# Patient Record
Sex: Female | Born: 1965 | Hispanic: No | Smoking: Current every day smoker
Health system: Southern US, Community
[De-identification: ages and names within clinical notes are randomized; demographics above are authoritative.]

## PROBLEM LIST (undated history)

## (undated) DIAGNOSIS — C73 Malignant neoplasm of thyroid gland: Secondary | ICD-10-CM

## (undated) DIAGNOSIS — F1011 Alcohol abuse, in remission: Secondary | ICD-10-CM

## (undated) DIAGNOSIS — N289 Disorder of kidney and ureter, unspecified: Secondary | ICD-10-CM

## (undated) DIAGNOSIS — J45909 Unspecified asthma, uncomplicated: Secondary | ICD-10-CM

## (undated) DIAGNOSIS — K769 Liver disease, unspecified: Secondary | ICD-10-CM

## (undated) DIAGNOSIS — F319 Bipolar disorder, unspecified: Secondary | ICD-10-CM

## (undated) DIAGNOSIS — F1110 Opioid abuse, uncomplicated: Secondary | ICD-10-CM

---

## 1999-04-02 ENCOUNTER — Emergency Department (HOSPITAL_COMMUNITY): Admission: EM | Admit: 1999-04-02 | Discharge: 1999-04-02 | Payer: Self-pay | Admitting: *Deleted

## 1999-04-24 ENCOUNTER — Encounter: Payer: Self-pay | Admitting: Family Medicine

## 1999-04-24 ENCOUNTER — Emergency Department (HOSPITAL_COMMUNITY): Admission: EM | Admit: 1999-04-24 | Discharge: 1999-04-24 | Payer: Self-pay | Admitting: Emergency Medicine

## 1999-05-06 ENCOUNTER — Encounter: Payer: Self-pay | Admitting: Emergency Medicine

## 1999-05-06 ENCOUNTER — Emergency Department (HOSPITAL_COMMUNITY): Admission: EM | Admit: 1999-05-06 | Discharge: 1999-05-06 | Payer: Self-pay | Admitting: Emergency Medicine

## 1999-06-22 ENCOUNTER — Encounter: Payer: Self-pay | Admitting: Emergency Medicine

## 1999-06-22 ENCOUNTER — Emergency Department (HOSPITAL_COMMUNITY): Admission: EM | Admit: 1999-06-22 | Discharge: 1999-06-22 | Payer: Self-pay | Admitting: Emergency Medicine

## 2005-09-29 ENCOUNTER — Ambulatory Visit: Payer: Self-pay

## 2005-09-29 ENCOUNTER — Emergency Department: Payer: Self-pay | Admitting: Emergency Medicine

## 2008-01-07 ENCOUNTER — Emergency Department: Payer: Self-pay | Admitting: Emergency Medicine

## 2008-01-13 ENCOUNTER — Ambulatory Visit: Payer: Self-pay | Admitting: Urology

## 2008-03-06 ENCOUNTER — Emergency Department: Payer: Self-pay | Admitting: Emergency Medicine

## 2008-07-21 ENCOUNTER — Emergency Department: Payer: Self-pay | Admitting: Emergency Medicine

## 2010-02-17 ENCOUNTER — Emergency Department: Payer: Self-pay | Admitting: Unknown Physician Specialty

## 2010-02-28 ENCOUNTER — Emergency Department: Payer: Self-pay | Admitting: Emergency Medicine

## 2010-03-17 ENCOUNTER — Emergency Department: Payer: Self-pay | Admitting: Emergency Medicine

## 2010-03-21 ENCOUNTER — Emergency Department: Payer: Self-pay | Admitting: Emergency Medicine

## 2010-07-10 ENCOUNTER — Emergency Department: Payer: Self-pay | Admitting: Emergency Medicine

## 2010-08-21 ENCOUNTER — Emergency Department: Payer: Self-pay | Admitting: Emergency Medicine

## 2010-09-03 ENCOUNTER — Emergency Department: Payer: Self-pay | Admitting: Emergency Medicine

## 2010-09-06 ENCOUNTER — Emergency Department: Payer: Self-pay | Admitting: Unknown Physician Specialty

## 2011-01-13 ENCOUNTER — Emergency Department: Payer: Self-pay | Admitting: Internal Medicine

## 2011-01-27 ENCOUNTER — Emergency Department: Payer: Self-pay | Admitting: Emergency Medicine

## 2011-04-02 ENCOUNTER — Emergency Department: Payer: Self-pay | Admitting: Emergency Medicine

## 2011-05-27 ENCOUNTER — Emergency Department: Payer: Self-pay | Admitting: Emergency Medicine

## 2011-05-29 ENCOUNTER — Ambulatory Visit: Payer: Self-pay | Admitting: Urology

## 2011-06-02 ENCOUNTER — Emergency Department: Payer: Self-pay | Admitting: Emergency Medicine

## 2011-06-06 ENCOUNTER — Emergency Department: Payer: Self-pay | Admitting: Emergency Medicine

## 2011-07-02 ENCOUNTER — Ambulatory Visit: Payer: Self-pay | Admitting: Urology

## 2011-07-23 ENCOUNTER — Emergency Department: Payer: Self-pay | Admitting: Emergency Medicine

## 2011-07-23 LAB — URINALYSIS, COMPLETE
Bacteria: NONE SEEN
Bilirubin,UR: NEGATIVE
Glucose,UR: NEGATIVE mg/dL (ref 0–75)
Ketone: NEGATIVE
Leukocyte Esterase: NEGATIVE
Nitrite: NEGATIVE
Ph: 7 (ref 4.5–8.0)
Protein: NEGATIVE
RBC,UR: 240 /HPF (ref 0–5)
Specific Gravity: 1.018 (ref 1.003–1.030)
Squamous Epithelial: 2
WBC UR: <1 /HPF (ref 0–5)

## 2011-08-25 ENCOUNTER — Emergency Department: Payer: Self-pay | Admitting: Emergency Medicine

## 2011-08-25 LAB — URINALYSIS, COMPLETE
Bacteria: NONE SEEN
Bilirubin,UR: NEGATIVE
Glucose,UR: NEGATIVE mg/dL (ref 0–75)
Ketone: NEGATIVE
Leukocyte Esterase: NEGATIVE
Nitrite: NEGATIVE
Ph: 6 (ref 4.5–8.0)
Protein: NEGATIVE
RBC,UR: 361 /HPF (ref 0–5)
Specific Gravity: 1.017 (ref 1.003–1.030)
Squamous Epithelial: 5
WBC UR: 2 /HPF (ref 0–5)

## 2011-09-18 ENCOUNTER — Emergency Department: Payer: Self-pay | Admitting: Emergency Medicine

## 2011-09-18 LAB — BASIC METABOLIC PANEL
Anion Gap: 8 (ref 7–16)
BUN: 10 mg/dL (ref 7–18)
Calcium, Total: 9 mg/dL (ref 8.5–10.1)
Chloride: 105 mmol/L (ref 98–107)
Co2: 26 mmol/L (ref 21–32)
Creatinine: 0.83 mg/dL (ref 0.60–1.30)
EGFR (African American): >60
EGFR (Non-African Amer.): >60
Glucose: 108 mg/dL — ABNORMAL HIGH (ref 65–99)
Osmolality: 277 (ref 275–301)
Potassium: 4 mmol/L (ref 3.5–5.1)
Sodium: 139 mmol/L (ref 136–145)

## 2011-09-18 LAB — CBC
HCT: 40.3 % (ref 35.0–47.0)
HGB: 13.7 g/dL (ref 12.0–16.0)
MCH: 32.9 pg (ref 26.0–34.0)
MCHC: 34 g/dL (ref 32.0–36.0)
MCV: 97 fL (ref 80–100)
Platelet: 360 10*3/uL (ref 150–440)
RBC: 4.17 10*6/uL (ref 3.80–5.20)
RDW: 12.7 % (ref 11.5–14.5)
WBC: 8.7 10*3/uL (ref 3.6–11.0)

## 2011-09-18 LAB — URINALYSIS, COMPLETE
Bacteria: NONE SEEN
Bilirubin,UR: NEGATIVE
Glucose,UR: NEGATIVE mg/dL (ref 0–75)
Leukocyte Esterase: NEGATIVE
Nitrite: NEGATIVE
Ph: 5 (ref 4.5–8.0)
Protein: NEGATIVE
RBC,UR: 952 /HPF (ref 0–5)
Specific Gravity: 1.023 (ref 1.003–1.030)
Squamous Epithelial: 15
WBC UR: NONE SEEN /HPF (ref 0–5)

## 2011-09-25 LAB — URINE CULTURE

## 2011-12-20 ENCOUNTER — Emergency Department: Payer: Self-pay | Admitting: *Deleted

## 2011-12-20 LAB — TSH: Thyroid Stimulating Horm: 15.6 u[IU]/mL — ABNORMAL HIGH

## 2011-12-20 LAB — URINALYSIS, COMPLETE
Bacteria: NONE SEEN
Bilirubin,UR: NEGATIVE
Blood: NEGATIVE
Glucose,UR: NEGATIVE mg/dL (ref 0–75)
Leukocyte Esterase: NEGATIVE
Nitrite: NEGATIVE
Ph: 6 (ref 4.5–8.0)
Protein: NEGATIVE
RBC,UR: NONE SEEN /HPF (ref 0–5)
Specific Gravity: 1.014 (ref 1.003–1.030)
Squamous Epithelial: 1
WBC UR: <1 /HPF (ref 0–5)

## 2011-12-20 LAB — PREGNANCY, URINE: Pregnancy Test, Urine: NEGATIVE m[IU]/mL

## 2012-09-14 ENCOUNTER — Emergency Department: Payer: Self-pay | Admitting: Emergency Medicine

## 2012-11-03 ENCOUNTER — Emergency Department: Payer: Self-pay | Admitting: Emergency Medicine

## 2012-11-03 LAB — URINALYSIS, COMPLETE
Ketone: NEGATIVE
Nitrite: NEGATIVE
Ph: 5 (ref 4.5–8.0)
Protein: 100
Specific Gravity: 1.01 (ref 1.003–1.030)
Squamous Epithelial: 3
WBC UR: 37 /HPF (ref 0–5)

## 2012-11-03 LAB — LIPASE, BLOOD: Lipase: 242 U/L (ref 73–393)

## 2012-11-03 LAB — CBC
HCT: 39.1 % (ref 35.0–47.0)
HGB: 13.4 g/dL (ref 12.0–16.0)
MCH: 32.6 pg (ref 26.0–34.0)
MCV: 95 fL (ref 80–100)
Platelet: 355 10*3/uL (ref 150–440)
RBC: 4.11 10*6/uL (ref 3.80–5.20)
RDW: 14.2 % (ref 11.5–14.5)
WBC: 16.1 10*3/uL — ABNORMAL HIGH (ref 3.6–11.0)

## 2012-11-03 LAB — COMPREHENSIVE METABOLIC PANEL
Bilirubin,Total: 0.2 mg/dL (ref 0.2–1.0)
Creatinine: 1.04 mg/dL (ref 0.60–1.30)
SGOT(AST): 26 U/L (ref 15–37)
Sodium: 135 mmol/L — ABNORMAL LOW (ref 136–145)

## 2012-12-11 ENCOUNTER — Emergency Department: Payer: Self-pay | Admitting: Emergency Medicine

## 2012-12-11 LAB — BASIC METABOLIC PANEL
Anion Gap: 8 (ref 7–16)
Calcium, Total: 8.9 mg/dL (ref 8.5–10.1)
Chloride: 109 mmol/L — ABNORMAL HIGH (ref 98–107)
Co2: 24 mmol/L (ref 21–32)
Glucose: 147 mg/dL — ABNORMAL HIGH (ref 65–99)
Potassium: 3.6 mmol/L (ref 3.5–5.1)

## 2012-12-11 LAB — CBC
HGB: 13.3 g/dL (ref 12.0–16.0)
MCV: 95 fL (ref 80–100)
Platelet: 361 10*3/uL (ref 150–440)
RBC: 4.04 10*6/uL (ref 3.80–5.20)

## 2013-01-14 ENCOUNTER — Emergency Department: Payer: Self-pay | Admitting: Emergency Medicine

## 2013-01-14 LAB — CBC
HCT: 37.5 % (ref 35.0–47.0)
HGB: 12.9 g/dL (ref 12.0–16.0)
MCH: 32.1 pg (ref 26.0–34.0)
MCV: 93 fL (ref 80–100)
Platelet: 345 10*3/uL (ref 150–440)
WBC: 8.8 10*3/uL (ref 3.6–11.0)

## 2013-05-03 ENCOUNTER — Emergency Department: Payer: Self-pay | Admitting: Emergency Medicine

## 2013-09-08 IMAGING — CR DG ABDOMEN 1V
1 series · 1 of 1 positions shown · non-contrast
Comparison: none

REASON FOR EXAM: RENAL COLIC
COMMENTS:

[view not recorded]
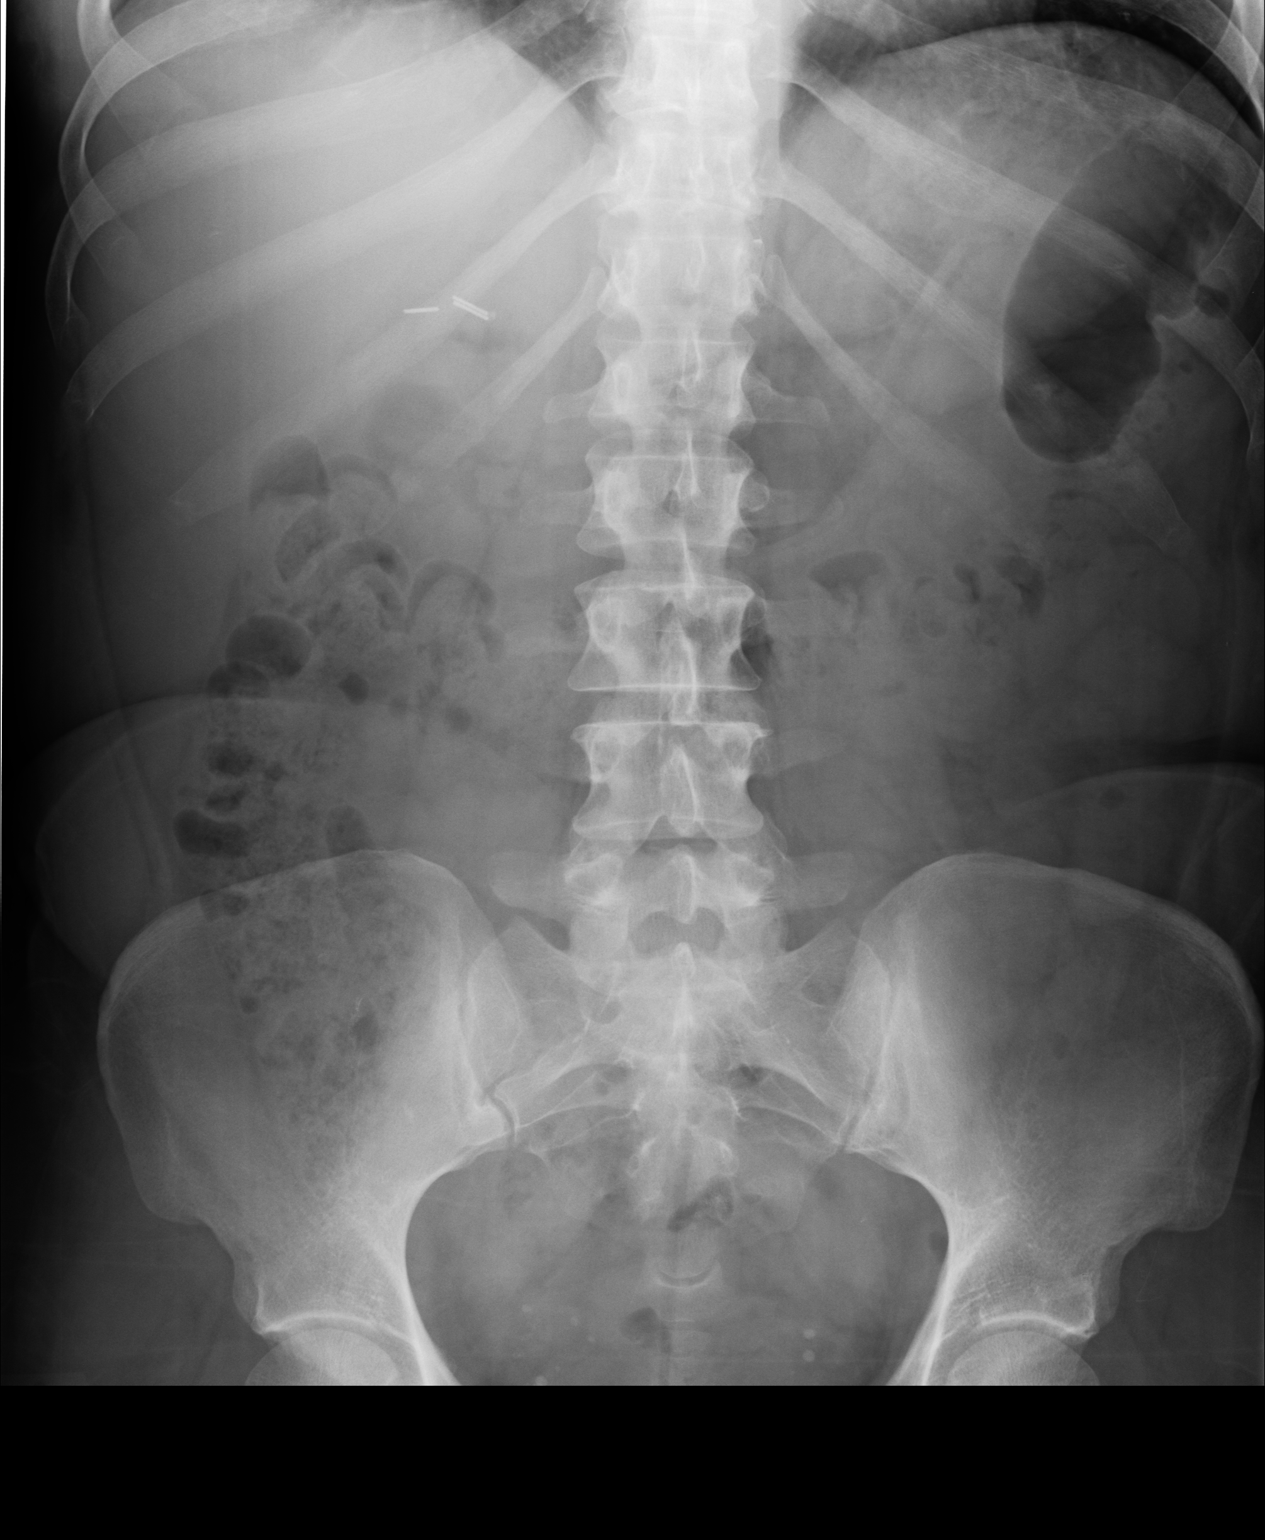

[1 of 1 positions shown; findings below may reference images not displayed]

PROCEDURE:     DXR - DXR KIDNEY URETER BLADDER  - May 29, 2011  [DATE]

RESULT:     An AP view of the abdomen is compared to the prior exam of
01/13/2008. No definite renal or ureteral calcifications are identified by
routine radiography. Phleboliths are noted in the pelvic area bilaterally.
The bowel gas pattern is normal. There is a moderate amount of fecal
material in the colon. Postoperative metallic clips are noted in the region
of the gallbladder bed.
IMPRESSION: 1.     No definite renal or ureteral calcifications are identified by
routine radiography.

## 2013-09-14 ENCOUNTER — Emergency Department: Payer: Self-pay | Admitting: Emergency Medicine

## 2013-09-14 LAB — COMPREHENSIVE METABOLIC PANEL
ALBUMIN: 3.5 g/dL (ref 3.4–5.0)
ALK PHOS: 85 U/L
ANION GAP: 3 — AB (ref 7–16)
AST: 21 U/L (ref 15–37)
BUN: 16 mg/dL (ref 7–18)
Bilirubin,Total: 0.1 mg/dL — ABNORMAL LOW (ref 0.2–1.0)
Calcium, Total: 8.6 mg/dL (ref 8.5–10.1)
Chloride: 109 mmol/L — ABNORMAL HIGH (ref 98–107)
Co2: 29 mmol/L (ref 21–32)
Creatinine: 0.88 mg/dL (ref 0.60–1.30)
EGFR (African American): >60
Glucose: 86 mg/dL (ref 65–99)
OSMOLALITY: 282 (ref 275–301)
Potassium: 3.9 mmol/L (ref 3.5–5.1)
SGPT (ALT): 39 U/L (ref 12–78)
Sodium: 141 mmol/L (ref 136–145)
Total Protein: 7 g/dL (ref 6.4–8.2)

## 2013-09-14 LAB — CBC
HCT: 40.2 % (ref 35.0–47.0)
HGB: 13.5 g/dL (ref 12.0–16.0)
MCH: 32.1 pg (ref 26.0–34.0)
MCHC: 33.6 g/dL (ref 32.0–36.0)
MCV: 96 fL (ref 80–100)
Platelet: 337 10*3/uL (ref 150–440)
RBC: 4.21 10*6/uL (ref 3.80–5.20)
RDW: 13.7 % (ref 11.5–14.5)
WBC: 10.5 10*3/uL (ref 3.6–11.0)

## 2013-09-14 LAB — URINALYSIS, COMPLETE
BACTERIA: NONE SEEN
Bilirubin,UR: NEGATIVE
Blood: NEGATIVE
GLUCOSE, UR: NEGATIVE mg/dL (ref 0–75)
Ketone: NEGATIVE
Nitrite: POSITIVE
PROTEIN: NEGATIVE
Ph: 5 (ref 4.5–8.0)
RBC,UR: 6 /HPF (ref 0–5)
Specific Gravity: 1.023 (ref 1.003–1.030)

## 2014-04-27 ENCOUNTER — Emergency Department: Payer: Self-pay | Admitting: Emergency Medicine

## 2015-02-14 IMAGING — CT CT ABD-PELV W/O CM
1 of 2 series · 15 of 32 positions shown, 19 images · non-contrast
Comparison: none

REASON FOR EXAM: (1) severe flank pain; (2) severe flank pain
COMMENTS:   May transport without cardiac monitor

[Series 2: 3mm soft tissue · axial · 0.68mm/px · z∈[-456,-74]mm · 15 of 139 slices shown, 19 images]
[im 6/139  soft-tissue]
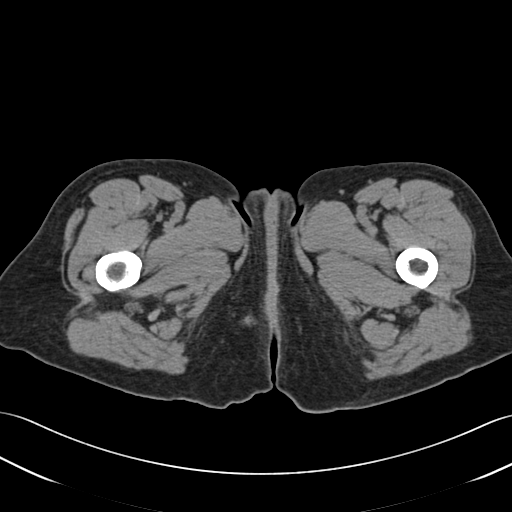
[im 6/139  bone]
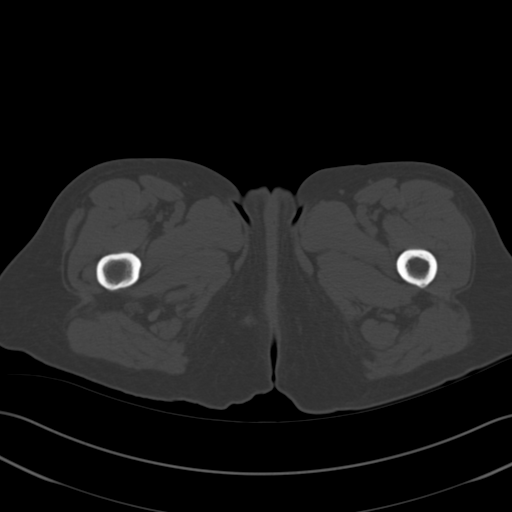
[im 17/139  soft-tissue]
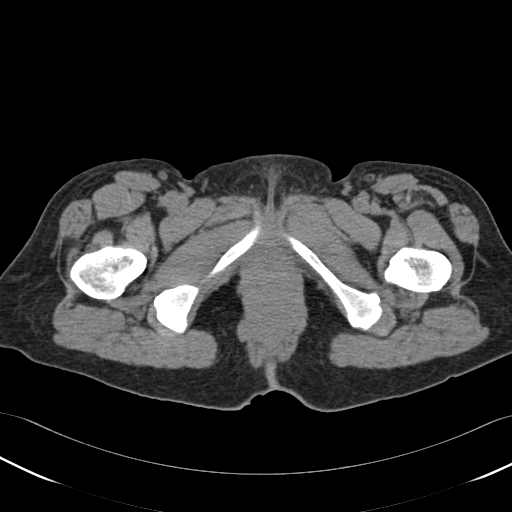
[im 28/139  soft-tissue]
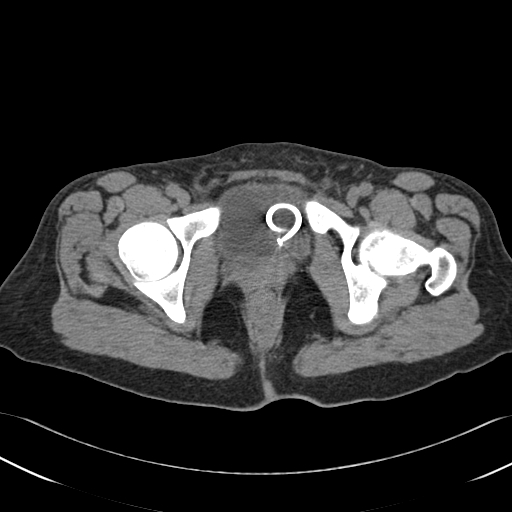
[im 39/139  soft-tissue]
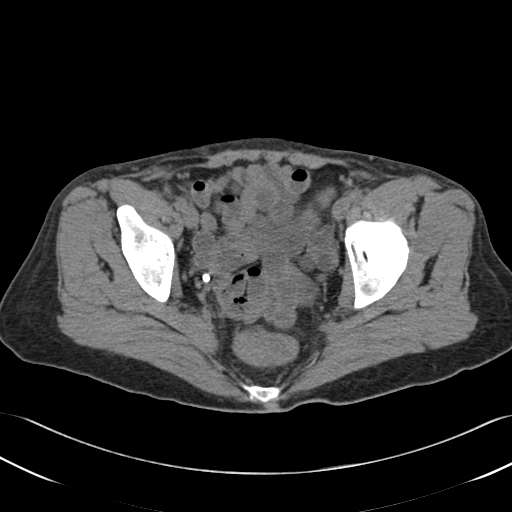
[im 50/139  soft-tissue]
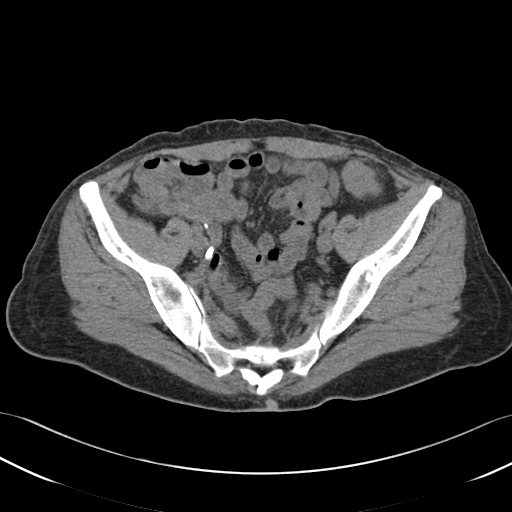
[im 61/139  soft-tissue]
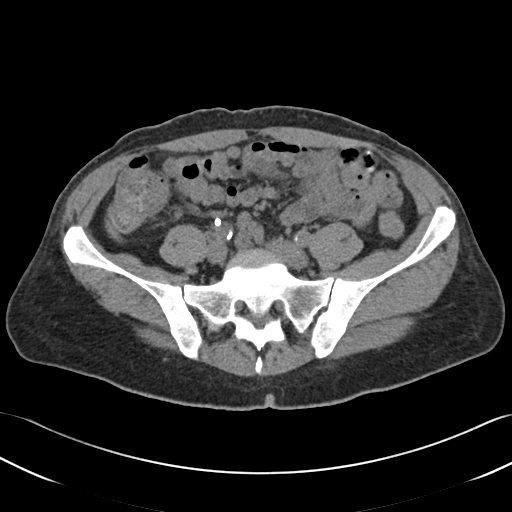
[im 72/139  soft-tissue]
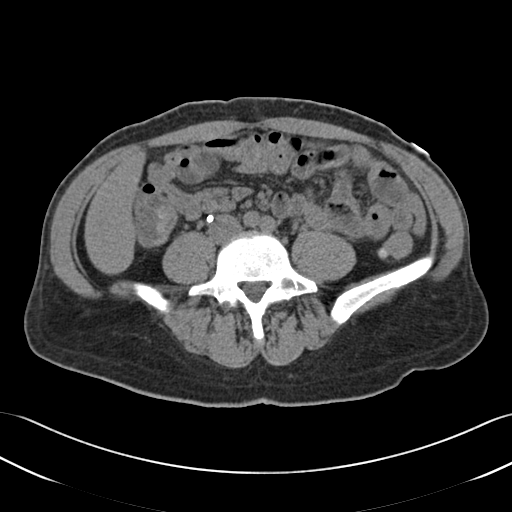
[im 78/139  soft-tissue]
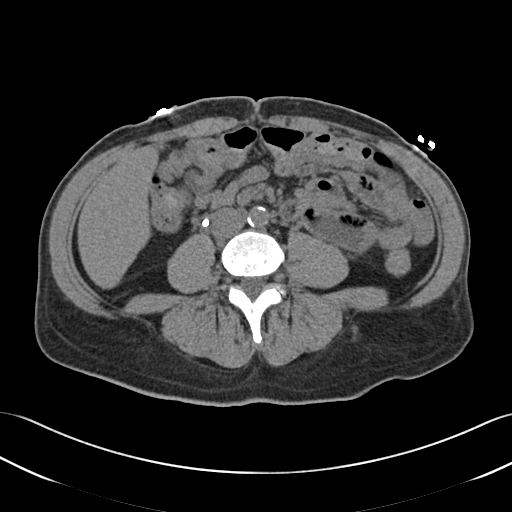
[im 89/139  soft-tissue]
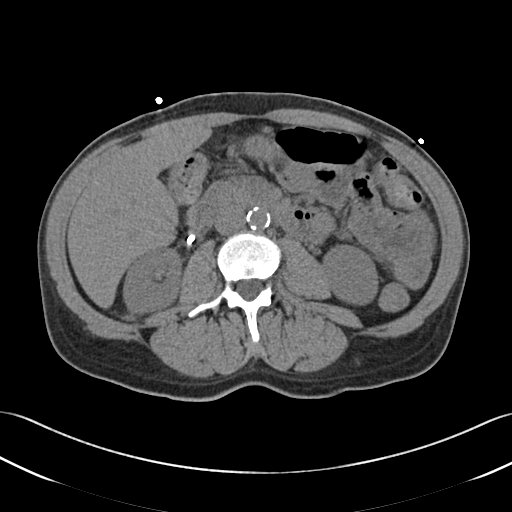
[im 89/139  bone]
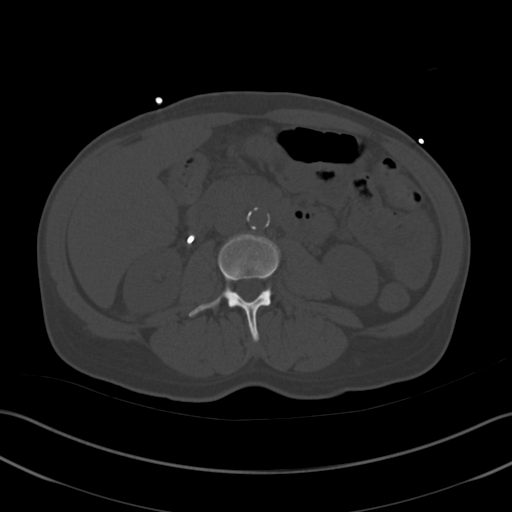
[im 100/139  soft-tissue]
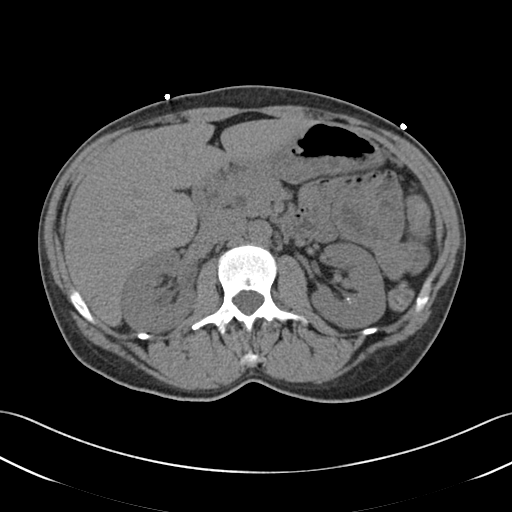
[im 111/139  soft-tissue]
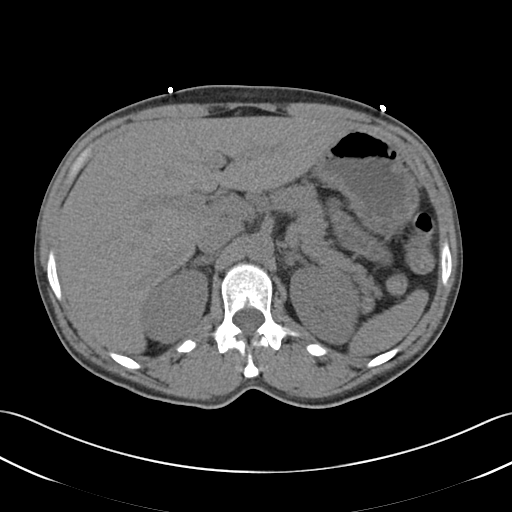
[im 116/139  lung]
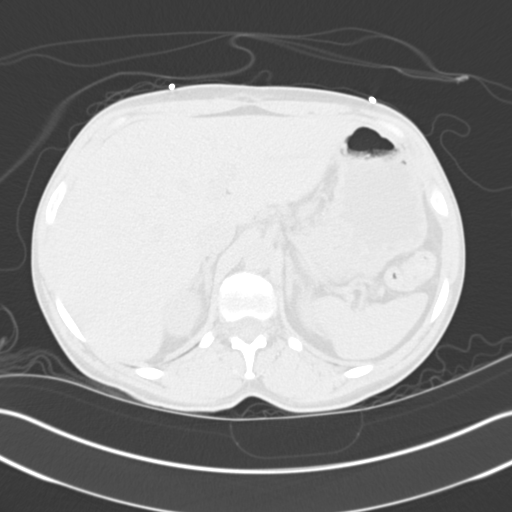
[im 122/139  soft-tissue]
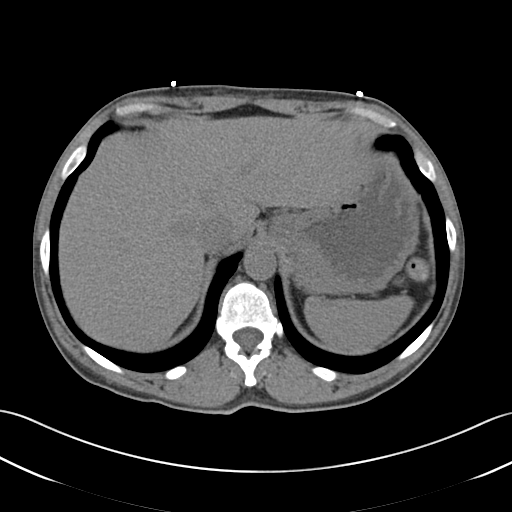
[im 122/139  lung]
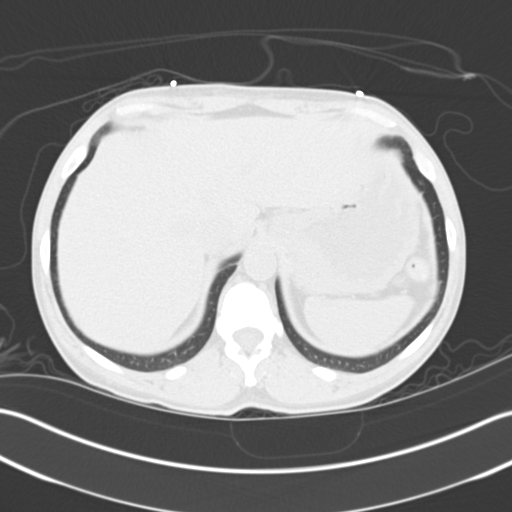
[im 127/139  lung]
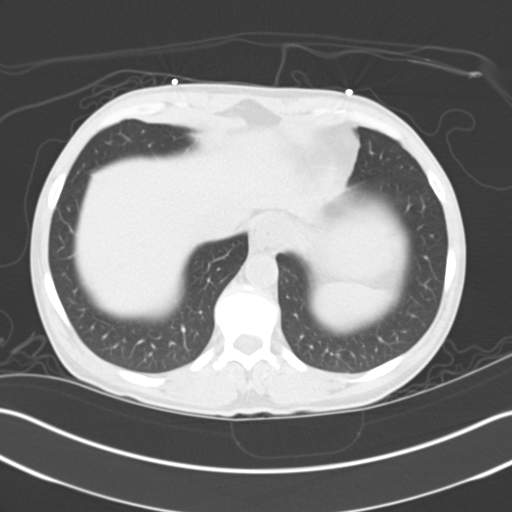
[im 133/139  soft-tissue]
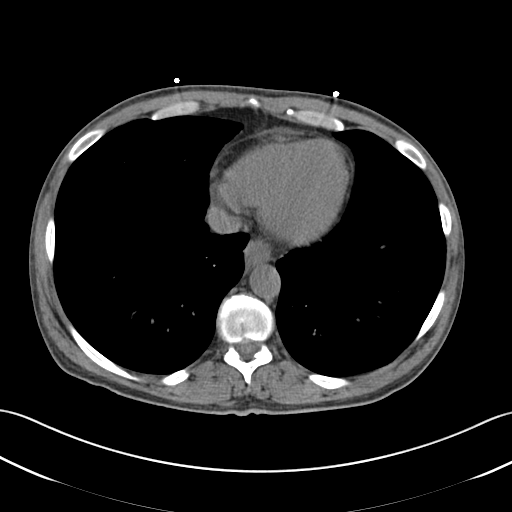
[im 133/139  lung]
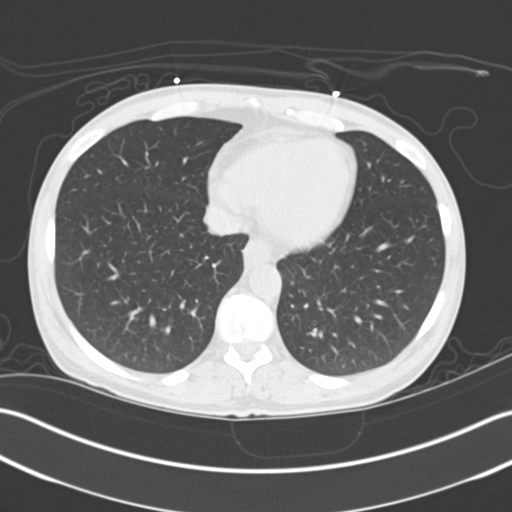

[15 of 32 positions shown; findings below may reference images not displayed]

PROCEDURE:     CT  - CT ABDOMEN AND PELVIS W[DATE] [DATE]

RESULT:     Noncontrast CT of the abdomen and pelvis is reconstructed at 3
mm slice thickness in the axial plane and compared images dated 18 September, 2011.

There is a double-J right ureteral stent in position. The proximal portion
of the stent appears to be in the proximal portion of the ureter rather than
in the renal pelvis. A coiled proximal portion of the stent appears to be at
the ureteropelvic junction region. Multiple calcific densities are seen
around the stent distally suggestive of phleboliths. No definite stones are
seen adjacent to the stent. There is a stone in the right kidney on image 48
measuring 1.5 no meters diameter. There is no hydronephrosis on the left.
Multiple 1 to 2 mm calculi are seen in the left renal collecting system. The
there is no perinephric stranding or fluid.

The lung bases appear clear. Cholecystectomy clips are present. There is a
moderate amount of air and fecal material scattered through the colon
without evidence of obstruction. Scattered atherosclerotic calcification is
present. The abdominal viscera otherwise appear to be unremarkable for
noncontrast exam. Bony structures appear within normal limits.
IMPRESSION: 1. Bilateral nephrolithiasis without hydronephrosis. Right-sided double-J
ureteral stent is present. The proximal portion of the stent appears to be
at the right ureteropelvic junction possibly into the proximal portion of
the right ureter. No definite stones are seen along the stent. There are
multiple calcific densities near the distal stent which may represent
phleboliths. The possibility of one of these representing a small stone just
posterior to the stent distally in the ureter cannot be completely excluded.

[REDACTED]
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
## 2015-04-27 ENCOUNTER — Emergency Department
Admission: EM | Admit: 2015-04-27 | Discharge: 2015-04-27 | Disposition: A | Discharge status: Home / Self Care | Payer: Self-pay | Attending: Emergency Medicine | Admitting: Emergency Medicine

## 2015-04-27 ENCOUNTER — Encounter: Payer: Self-pay | Admitting: Emergency Medicine

## 2015-04-27 DIAGNOSIS — R51 Headache: Secondary | ICD-10-CM | POA: Insufficient documentation

## 2015-04-27 DIAGNOSIS — M67441 Ganglion, right hand: Secondary | ICD-10-CM | POA: Insufficient documentation

## 2015-04-27 DIAGNOSIS — L723 Sebaceous cyst: Secondary | ICD-10-CM | POA: Insufficient documentation

## 2015-04-27 DIAGNOSIS — Z880 Allergy status to penicillin: Secondary | ICD-10-CM | POA: Insufficient documentation

## 2015-04-27 HISTORY — DX: Liver disease, unspecified: K76.9

## 2015-04-27 MED ORDER — OXYCODONE HCL 5 MG PO TABS: 5.0000 mg | ORAL_TABLET | Freq: Four times a day (QID) | ORAL | Status: DC | PRN

## 2015-04-27 NOTE — Discharge Instructions (Signed)
Ganglion Cyst  A ganglion cyst is a noncancerous, fluid-filled lump that occurs near joints or tendons. The ganglion cyst grows out of a joint or the lining of a tendon. It most often develops in the hand or wrist, but it can also develop in the shoulder, elbow, hip, knee, ankle, or foot. The round or oval ganglion cyst can be the size of a pea or larger than a grape. Increased activity may enlarge the size of the cyst because more fluid starts to build up.   CAUSES  It is not known what causes a ganglion cyst to grow. However, it may be related to:  · Inflammation or irritation around the joint.  · An injury.  · Repetitive movements or overuse.  · Arthritis.  RISK FACTORS  Risk factors include:  · Being a woman.  · Being age 20-50.  SIGNS AND SYMPTOMS  Symptoms may include:   · A lump. This most often appears on the hand or wrist, but it can occur in other areas of the body.  · Tingling.  · Pain.  · Numbness.  · Muscle weakness.  · Weak grip.  · Less movement in a joint.  DIAGNOSIS  Ganglion cysts are most often diagnosed based on a physical exam. Your health care provider will feel the lump and may shine a light alongside it. If it is a ganglion cyst, a light often shines through it. Your health care provider may order an X-ray, ultrasound, or MRI to rule out other conditions.  TREATMENT  Ganglion cysts usually go away on their own without treatment. If pain or other symptoms are involved, treatment may be needed. Treatment is also needed if the ganglion cyst limits your movement or if it gets infected. Treatment may include:  · Wearing a brace or splint on your wrist or finger.  · Taking anti-inflammatory medicine.  · Draining fluid from the lump with a needle (aspiration).  · Injecting a steroid into the joint.  · Surgery to remove the ganglion cyst.  HOME CARE INSTRUCTIONS  · Do not press on the ganglion cyst, poke it with a needle, or hit it.  · Take medicines only as directed by your health care  provider.  · Wear your brace or splint as directed by your health care provider.  · Watch your ganglion cyst for any changes.  · Keep all follow-up visits as directed by your health care provider. This is important.  SEEK MEDICAL CARE IF:  · Your ganglion cyst becomes larger or more painful.  · You have increased redness, red streaks, or swelling.  · You have pus coming from the lump.  · You have weakness or numbness in the affected area.  · You have a fever or chills.     This information is not intended to replace advice given to you by your health care provider. Make sure you discuss any questions you have with your health care provider.     Document Released: 06/29/2000 Document Revised: 07/23/2014 Document Reviewed: 12/15/2013  Elsevier Interactive Patient Education ©2016 [employer]

## 2015-04-27 NOTE — ED Provider Notes (Signed)
[facility] Emergency Department Provider Note  ____________________________________________  Time seen: Approximately 11:19 AM  I have reviewed the triage vital signs and the nursing notes.   HISTORY  Chief Complaint Hand Pain and Facial Pain  HPI Valerie Wallace is a 49 y.o. female is here with complaint of "bump on her face" and right hand pain. Patient states that she has had a ganglion cyst on her right hand for "a long time". Patient states that today there is more pain with gripping. She also has been squeezing the bump on her face and now red. Patient states that she is on the liver transplant list at [facility]. She denies any fever or chills. She denies any paresthesias into her right hand. She admits that she has been squeezing the bump on her face to push "stuff out".Patient currently states her pain is 10 out of 10.  Past Medical History  Diagnosis Date  . Liver disorder     There are no active problems to display for this patient.   No past surgical history on file.  Current Outpatient Rx  Name  Route  Sig  Dispense  Refill  . oxyCODONE (ROXICODONE) 5 MG immediate release tablet   Oral   Take 1 tablet (5 mg total) by mouth every 6 (six) hours as needed for severe pain.   12 tablet   0     Allergies Ibuprofen; Sulfa antibiotics; Tylenol; and Penicillins  No family history on file.  Social History Social History  Substance Use Topics  . Smoking status: Not on file  . Smokeless tobacco: Not on file  . Alcohol Use: Not on file    Review of Systems Constitutional: No fever/chills Eyes: No visual changes. ENT: No sore throat. Cardiovascular: Denies chest pain. Respiratory: Denies shortness of breath. Gastrointestinal:  No nausea, no vomiting.  Musculoskeletal: Negative for back pain. Positive for right hand pain Skin: Negative for rash. Neurological: Negative for headaches, focal weakness or numbness.  10-point ROS otherwise  negative.  ____________________________________________   PHYSICAL EXAM:  VITAL SIGNS: ED Triage Vitals  Enc Vitals Group     BP 04/27/15 1026 140/80 mmHg     Pulse Rate 04/27/15 1026 100     Resp 04/27/15 1026 18     Temp 04/27/15 1026 98.3 F (36.8 C)     Temp src --      SpO2 04/27/15 1026 97 %     Weight --      Height --      Head Cir --      Peak Flow --      Pain Score 04/27/15 1024 10     Pain Loc --      Pain Edu? --      Excl. in [place]? --     Constitutional: Alert and oriented. Well appearing and in no acute distress. Eyes: Conjunctivae are normal. PERRL. EOMI. Head: Atraumatic. Nose: No congestion/rhinnorhea. Neck: No stridor.   Hematological/Lymphatic/Immunilogical: No cervical lymphadenopathy. Cardiovascular: Normal rate, regular rhythm. Grossly normal heart sounds.  Good peripheral circulation. Respiratory: Normal respiratory effort.  No retractions. Lungs CTAB. Gastrointestinal: Soft and nontender. No distention. No abdominal bruits. No CVA tenderness. Musculoskeletal: There is decreased range of motion with the right thumb secondary to pain. There is no gross deformity. There appears to be a ganglion cyst present on the dorsum of the right extensor tendon. Grip strength is less on the right than the left in comparison. Capillary blood less than 2  seconds No lower extremity tenderness nor edema.  No joint effusions. Neurologic:  Normal speech and language. No gross focal neurologic deficits are appreciated. No gait instability. Skin:  Skin is warm, dry and intact. No rash noted. Right hand no erythema, ecchymosis or abrasions. Right lateral nasal aspect there is a red papule present without drainage. This area is tender to touch. There is no fluctuant area Psychiatric: Mood and affect are normal. Speech and behavior are normal.  ____________________________________________   LABS (all labs ordered are listed, but only abnormal results are displayed)  Labs  Reviewed - No data to display  PROCEDURES  Procedure(s) performed: None  Critical Care performed: No  ____________________________________________   INITIAL IMPRESSION / ASSESSMENT AND PLAN / ED COURSE  Pertinent labs & imaging results that were available during my care of the patient were reviewed by me and considered in my medical decision making (see chart for details).  Patient was given a work note for the next 2 days. She is also placed on oxycodone 5 mg every 6 hours for pain. She is to follow-up with Dr. Rudene Christians about her hand pain. She is also told to not to squeeze her papule on her face. She may use warm compresses to this area. She is to follow-up with Dr. Nehemiah Massed at [facility] skin center if any continued problems. ____________________________________________   FINAL CLINICAL IMPRESSION(S) / ED DIAGNOSES  Final diagnoses:  Ganglion cyst of flexor tendon sheath of finger of right hand  Sebaceous cyst      Johnn Hai, PA-C 04/27/15 1441  Schuyler Amor, MD 04/27/15 1529

## 2015-04-27 NOTE — ED Notes (Signed)
Has had bumps on face and hand for long time (says ganglion cyst.)  Says more pain and her face hurts from the one there.  Says the bumps are getting bigger. .  Says she is on liver transplant list.

## 2015-05-03 ENCOUNTER — Encounter: Payer: Self-pay | Admitting: Emergency Medicine

## 2015-05-03 ENCOUNTER — Emergency Department
Admission: EM | Admit: 2015-05-03 | Discharge: 2015-05-03 | Disposition: A | Discharge status: Home / Self Care | Payer: Self-pay | Attending: Emergency Medicine | Admitting: Emergency Medicine

## 2015-05-03 DIAGNOSIS — Z720 Tobacco use: Secondary | ICD-10-CM | POA: Insufficient documentation

## 2015-05-03 DIAGNOSIS — Z880 Allergy status to penicillin: Secondary | ICD-10-CM | POA: Insufficient documentation

## 2015-05-03 DIAGNOSIS — M67441 Ganglion, right hand: Secondary | ICD-10-CM | POA: Insufficient documentation

## 2015-05-03 HISTORY — DX: Malignant neoplasm of thyroid gland: C73

## 2015-05-03 HISTORY — DX: Disorder of kidney and ureter, unspecified: N28.9

## 2015-05-03 MED ORDER — OXYCODONE HCL 5 MG PO TABS: 5.0000 mg | ORAL_TABLET | Freq: Three times a day (TID) | ORAL | Status: DC | PRN

## 2015-05-03 MED ORDER — TRAMADOL HCL 50 MG PO TABS: 50.0000 mg | ORAL_TABLET | Freq: Once | ORAL | Status: DC

## 2015-05-03 NOTE — Discharge Instructions (Signed)
Ganglion Cyst  A ganglion cyst is a noncancerous, fluid-filled lump that occurs near joints or tendons. The ganglion cyst grows out of a joint or the lining of a tendon. It most often develops in the hand or wrist, but it can also develop in the shoulder, elbow, hip, knee, ankle, or foot. The round or oval ganglion cyst can be the size of a pea or larger than a grape. Increased activity may enlarge the size of the cyst because more fluid starts to build up.   CAUSES  It is not known what causes a ganglion cyst to grow. However, it may be related to:  · Inflammation or irritation around the joint.  · An injury.  · Repetitive movements or overuse.  · Arthritis.  RISK FACTORS  Risk factors include:  · Being a woman.  · Being age 20-50.  SIGNS AND SYMPTOMS  Symptoms may include:   · A lump. This most often appears on the hand or wrist, but it can occur in other areas of the body.  · Tingling.  · Pain.  · Numbness.  · Muscle weakness.  · Weak grip.  · Less movement in a joint.  DIAGNOSIS  Ganglion cysts are most often diagnosed based on a physical exam. Your health care provider will feel the lump and may shine a light alongside it. If it is a ganglion cyst, a light often shines through it. Your health care provider may order an X-ray, ultrasound, or MRI to rule out other conditions.  TREATMENT  Ganglion cysts usually go away on their own without treatment. If pain or other symptoms are involved, treatment may be needed. Treatment is also needed if the ganglion cyst limits your movement or if it gets infected. Treatment may include:  · Wearing a brace or splint on your wrist or finger.  · Taking anti-inflammatory medicine.  · Draining fluid from the lump with a needle (aspiration).  · Injecting a steroid into the joint.  · Surgery to remove the ganglion cyst.  HOME CARE INSTRUCTIONS  · Do not press on the ganglion cyst, poke it with a needle, or hit it.  · Take medicines only as directed by your health care  provider.  · Wear your brace or splint as directed by your health care provider.  · Watch your ganglion cyst for any changes.  · Keep all follow-up visits as directed by your health care provider. This is important.  SEEK MEDICAL CARE IF:  · Your ganglion cyst becomes larger or more painful.  · You have increased redness, red streaks, or swelling.  · You have pus coming from the lump.  · You have weakness or numbness in the affected area.  · You have a fever or chills.     This information is not intended to replace advice given to you by your health care provider. Make sure you discuss any questions you have with your health care provider.     Document Released: 06/29/2000 Document Revised: 07/23/2014 Document Reviewed: 12/15/2013  Elsevier Interactive Patient Education ©2016 [employer]

## 2015-05-03 NOTE — ED Notes (Signed)
Pt to ed with c/o left hand pain, states was seen here last week for same but was referred to md that she is unable to see.  Wants a new referral to ortho md.

## 2015-05-03 NOTE — ED Notes (Signed)
Was seen last week and referred to ortho but is not able to see that md. Left hand/wrist area swollen and tender .states she needs another referral. Limited movement noted d/t swelling and pain. Pt is tearful at present

## 2015-05-03 NOTE — ED Provider Notes (Signed)
[facility] Emergency Department Provider Note  ____________________________________________  Time seen: Approximately 10:19 AM  I have reviewed the triage vital signs and the nursing notes.   HISTORY  Chief Complaint Hand Pain    HPI Valerie Wallace is a 49 y.o. female patient complaining of pain to her right hand secondary to a ganglion cyst. Patient was seen last week for same complaint but the referring doctor refused to see a fist secondary to outstanding bill. Patient requests a referral to another DOCTOR. PATIENT STATES SHE IS UNABLE TAKE TYLENOL PRODUCTS SECONDARY TO LIVER DISORDER HASN'T PLACED ON A TRANSPLANT LIST [facility]. PATIENT IS RATING HER PAIN AS A 10 OVER 10. PATIENT STATES PAIN INCREASE WITH FLEXION OF THE RIGHT THUMB.  Past Medical History  Diagnosis Date  . Liver disorder   . Renal disorder   . Thyroid cancer ([place])     There are no active problems to display for this patient.   History reviewed. No pertinent past surgical history.  Current Outpatient Rx  Name  Route  Sig  Dispense  Refill  . oxyCODONE (ROXICODONE) 5 MG immediate release tablet   Oral   Take 1 tablet (5 mg total) by mouth every 6 (six) hours as needed for severe pain.   12 tablet   0   . oxyCODONE (ROXICODONE) 5 MG immediate release tablet   Oral   Take 1 tablet (5 mg total) by mouth every 8 (eight) hours as needed.   20 tablet   0     Allergies Ibuprofen; Sulfa antibiotics; Tylenol; and Penicillins  History reviewed. No pertinent family history.  Social History Social History  Substance Use Topics  . Smoking status: Current Every Day Smoker  . Smokeless tobacco: None  . Alcohol Use: No    Review of Systems Constitutional: No fever/chills Eyes: No visual changes. ENT: No sore throat. Cardiovascular: Denies chest pain. Respiratory: Denies shortness of breath. Gastrointestinal: No abdominal pain.  No nausea, no vomiting.  No diarrhea.  No  constipation. Genitourinary: Negative for dysuria. Musculoskeletal: Negative for back pain. Skin: Negative for rash. Neurological: Negative for headaches, focal weakness or numbness. Allergic/Immunilogical: See medication list 10-point ROS otherwise negative.  ____________________________________________   PHYSICAL EXAM:  VITAL SIGNS: ED Triage Vitals  Enc Vitals Group     BP 05/03/15 0946 143/97 mmHg     Pulse Rate 05/03/15 0946 88     Resp 05/03/15 0946 20     Temp 05/03/15 0946 98.9 F (37.2 C)     Temp Source 05/03/15 0946 Oral     SpO2 05/03/15 0946 97 %     Weight 05/03/15 0946 130 lb (58.968 kg)     Height 05/03/15 0946 4\' 11"  (1.499 m)     Head Cir --      Peak Flow --      Pain Score 05/03/15 0946 10     Pain Loc --      Pain Edu? --      Excl. in [place]? --     Constitutional: Alert and oriented. Well appearing and in no acute distress. Eyes: Conjunctivae are normal. PERRL. EOMI. Head: Atraumatic. Nose: No congestion/rhinnorhea. Mouth/Throat: Mucous membranes are moist.  Oropharynx non-erythematous. Neck: No stridor.  No cervical spine tenderness to palpation. Cardiovascular: Normal rate, regular rhythm. Grossly normal heart sounds.  Good peripheral circulation. Respiratory: Normal respiratory effort.  No retractions. Lungs CTAB. Gastrointestinal: Soft and nontender. No distention. No abdominal bruits. No CVA tenderness. Musculoskeletal: No deformity  but marked edema . Decreased range of motion with extension of the first digit right hand. Neurovascular intact. Neurologic:  Normal speech and language. No gross focal neurologic deficits are appreciated. No gait instability. Skin:  Skin is warm, dry and intact. No rash noted. Palpable lesion dorsal of the right extension tendon of the first digit Psychiatric: Mood and affect are normal. Speech and behavior are normal.  ____________________________________________   LABS (all labs ordered are listed, but only  abnormal results are displayed)  Labs Reviewed - No data to display ____________________________________________  EKG   ____________________________________________  RADIOLOGY   ____________________________________________   PROCEDURES  Procedure(s) performed: None  Critical Care performed: No  ____________________________________________   INITIAL IMPRESSION / ASSESSMENT AND PLAN / ED COURSE  Pertinent labs & imaging results that were available during my care of the patient were reviewed by me and considered in my medical decision making (see chart for details).  Ganglion cyst. Patient referred to Dr. Tamala Julian for definitive orthopedic evaluation and treatment. Patient given a work excuse for 2 days. Patient given a prescription for oxycodone to take as directed for pain. ____________________________________________   FINAL CLINICAL IMPRESSION(S) / ED DIAGNOSES  Final diagnoses:  Ganglion cyst of flexor tendon sheath of finger of right hand      Sable Feil, PA-C 05/03/15 1036  Daymon Larsen, MD 05/03/15 [phone]

## 2015-05-20 ENCOUNTER — Other Ambulatory Visit: Payer: Self-pay

## 2015-05-20 ENCOUNTER — Encounter: Payer: Self-pay | Admitting: Urgent Care

## 2015-05-20 ENCOUNTER — Emergency Department
Admission: EM | Admit: 2015-05-20 | Discharge: 2015-05-20 | Disposition: A | Discharge status: Home / Self Care | Payer: Self-pay | Attending: Emergency Medicine | Admitting: Emergency Medicine

## 2015-05-20 DIAGNOSIS — Z880 Allergy status to penicillin: Secondary | ICD-10-CM | POA: Insufficient documentation

## 2015-05-20 DIAGNOSIS — T404X1A Poisoning by other synthetic narcotics, accidental (unintentional), initial encounter: Secondary | ICD-10-CM | POA: Insufficient documentation

## 2015-05-20 DIAGNOSIS — R55 Syncope and collapse: Secondary | ICD-10-CM | POA: Insufficient documentation

## 2015-05-20 DIAGNOSIS — Y998 Other external cause status: Secondary | ICD-10-CM | POA: Insufficient documentation

## 2015-05-20 DIAGNOSIS — N390 Urinary tract infection, site not specified: Secondary | ICD-10-CM | POA: Insufficient documentation

## 2015-05-20 DIAGNOSIS — Y9389 Activity, other specified: Secondary | ICD-10-CM | POA: Insufficient documentation

## 2015-05-20 DIAGNOSIS — F1290 Cannabis use, unspecified, uncomplicated: Secondary | ICD-10-CM | POA: Insufficient documentation

## 2015-05-20 DIAGNOSIS — Z720 Tobacco use: Secondary | ICD-10-CM | POA: Insufficient documentation

## 2015-05-20 DIAGNOSIS — F1490 Cocaine use, unspecified, uncomplicated: Secondary | ICD-10-CM | POA: Insufficient documentation

## 2015-05-20 DIAGNOSIS — Y9289 Other specified places as the place of occurrence of the external cause: Secondary | ICD-10-CM | POA: Insufficient documentation

## 2015-05-20 DIAGNOSIS — T50901A Poisoning by unspecified drugs, medicaments and biological substances, accidental (unintentional), initial encounter: Secondary | ICD-10-CM

## 2015-05-20 DIAGNOSIS — F1110 Opioid abuse, uncomplicated: Secondary | ICD-10-CM | POA: Insufficient documentation

## 2015-05-20 HISTORY — DX: Unspecified asthma, uncomplicated: J45.909

## 2015-05-20 HISTORY — DX: Bipolar disorder, unspecified: F31.9

## 2015-05-20 HISTORY — DX: Alcohol abuse, in remission: F10.11

## 2015-05-20 HISTORY — DX: Opioid abuse, uncomplicated: F11.10

## 2015-05-20 LAB — COMPREHENSIVE METABOLIC PANEL
ALK PHOS: 121 U/L (ref 38–126)
ALT: 224 U/L — AB (ref 14–54)
AST: 201 U/L — AB (ref 15–41)
Albumin: 4.5 g/dL (ref 3.5–5.0)
Anion gap: 5 (ref 5–15)
BILIRUBIN TOTAL: 0.4 mg/dL (ref 0.3–1.2)
BUN: 17 mg/dL (ref 6–20)
CALCIUM: 9.7 mg/dL (ref 8.9–10.3)
CHLORIDE: 103 mmol/L (ref 101–111)
CO2: 24 mmol/L (ref 22–32)
CREATININE: 1.08 mg/dL — AB (ref 0.44–1.00)
GFR calc Af Amer: >60 mL/min (ref >60)
GFR, EST NON AFRICAN AMERICAN: 60 mL/min — AB (ref >60)
Glucose, Bld: 77 mg/dL (ref 65–99)
Potassium: 3.7 mmol/L (ref 3.5–5.1)
Sodium: 132 mmol/L — ABNORMAL LOW (ref 135–145)
Total Protein: 8.2 g/dL — ABNORMAL HIGH (ref 6.5–8.1)

## 2015-05-20 LAB — URINALYSIS COMPLETE WITH MICROSCOPIC (ARMC ONLY)
Bilirubin Urine: NEGATIVE
Glucose, UA: NEGATIVE mg/dL
Hgb urine dipstick: NEGATIVE
KETONES UR: NEGATIVE mg/dL
Leukocytes, UA: NEGATIVE
NITRITE: POSITIVE — AB
PH: 7 (ref 5.0–8.0)
PROTEIN: 30 mg/dL — AB
SPECIFIC GRAVITY, URINE: 1.014 (ref 1.005–1.030)

## 2015-05-20 LAB — CBC WITH DIFFERENTIAL/PLATELET
BASOS ABS: 0.1 10*3/uL (ref 0–0.1)
Basophils Relative: 1 %
Eosinophils Absolute: 0.1 10*3/uL (ref 0–0.7)
Eosinophils Relative: 1 %
HEMATOCRIT: 44.6 % (ref 35.0–47.0)
HEMOGLOBIN: 14.9 g/dL (ref 12.0–16.0)
LYMPHS ABS: 1.2 10*3/uL (ref 1.0–3.6)
LYMPHS PCT: 11 %
MCH: 30.9 pg (ref 26.0–34.0)
MCHC: 33.4 g/dL (ref 32.0–36.0)
MCV: 92.5 fL (ref 80.0–100.0)
Monocytes Absolute: 0.6 10*3/uL (ref 0.2–0.9)
Monocytes Relative: 6 %
NEUTROS ABS: 8.5 10*3/uL — AB (ref 1.4–6.5)
NEUTROS PCT: 81 %
PLATELETS: 303 10*3/uL (ref 150–440)
RBC: 4.82 MIL/uL (ref 3.80–5.20)
RDW: 13.4 % (ref 11.5–14.5)
WBC: 10.5 10*3/uL (ref 3.6–11.0)

## 2015-05-20 LAB — URINE DRUG SCREEN, QUALITATIVE (ARMC ONLY)
AMPHETAMINES, UR SCREEN: NOT DETECTED
BENZODIAZEPINE, UR SCRN: NOT DETECTED
Barbiturates, Ur Screen: NOT DETECTED
CANNABINOID 50 NG, UR ~~LOC~~: POSITIVE — AB
Cocaine Metabolite,Ur ~~LOC~~: POSITIVE — AB
MDMA (Ecstasy)Ur Screen: NOT DETECTED
Methadone Scn, Ur: NOT DETECTED
Opiate, Ur Screen: POSITIVE — AB
PHENCYCLIDINE (PCP) UR S: NOT DETECTED
Tricyclic, Ur Screen: NOT DETECTED

## 2015-05-20 LAB — TROPONIN I: Troponin I: <0.03 ng/mL (ref <0.031)

## 2015-05-20 LAB — ACETAMINOPHEN LEVEL

## 2015-05-20 LAB — SALICYLATE LEVEL: Salicylate Lvl: <4 mg/dL (ref 2.8–30.0)

## 2015-05-20 MED ORDER — SODIUM CHLORIDE 0.9 % IV BOLUS (SEPSIS)
1000.0000 mL | Freq: Once | INTRAVENOUS | Status: AC
  Administered 2015-05-20: 1000 mL via INTRAVENOUS

## 2015-05-20 MED ORDER — FLUCONAZOLE 50 MG PO TABS
150.0000 mg | ORAL_TABLET | Freq: Once | ORAL | Status: AC
  Administered 2015-05-20: 150 mg via ORAL
  Filled 2015-05-20: qty 1

## 2015-05-20 MED ORDER — CIPROFLOXACIN HCL 500 MG PO TABS
500.0000 mg | ORAL_TABLET | Freq: Once | ORAL | Status: AC
  Administered 2015-05-20: 500 mg via ORAL
  Filled 2015-05-20: qty 1

## 2015-05-20 NOTE — ED Provider Notes (Signed)
[facility] Emergency Department Provider Note  ____________________________________________  Time seen: Approximately 2:01 AM  I have reviewed the triage vital signs and the nursing notes.   HISTORY  Chief Complaint Drug Overdose    HPI Valerie Wallace is a 49 y.o. female who presents to the ED via EMS from work ([place]) with a chief complaint of syncope secondary to unintentional overdose. Patient has a history of chronic back pain as well as ganglion cyst pain for which she takes Percocets. States her Percocets have not been working to control her pain so she chewed 2 fentanyl patches of unknown strength which she got from a friend. Per EMS, patient reportedly walked to her table with trace of food when she "fell out and became unresponsive". Narcan 2 mg IM was administered per EMS with good results. Patient denies injury sustained secondary to her fall. States she has been up since 5 AM and working 14 hour days. Denies recent travel. Denies recent fever, chills, chest pain, shortness of breath, abdominal pain, nausea, vomiting, diarrhea. Currently resting in no acute distress and voicing no complaints.   Past Medical History  Diagnosis Date  . Liver disorder   . Renal disorder   . Thyroid cancer ([place])   . Asthma   . Bipolar disorder ([place])   . History of ETOH abuse   . Narcotic abuse     There are no active problems to display for this patient.   History reviewed. No pertinent past surgical history.  Current Outpatient Rx  Name  Route  Sig  Dispense  Refill  . oxyCODONE (ROXICODONE) 5 MG immediate release tablet   Oral   Take 1 tablet (5 mg total) by mouth every 6 (six) hours as needed for severe pain.   12 tablet   0   . oxyCODONE (ROXICODONE) 5 MG immediate release tablet   Oral   Take 1 tablet (5 mg total) by mouth every 8 (eight) hours as needed.   20 tablet   0     Allergies Ibuprofen; Sulfa antibiotics; Tylenol; and  Penicillins  No family history on file.  Social History Social History  Substance Use Topics  . Smoking status: Current Every Day Smoker  . Smokeless tobacco: None  . Alcohol Use: Yes    Review of Systems Constitutional: No fever/chills Eyes: No visual changes. ENT: No sore throat. Cardiovascular: Denies chest pain. Respiratory: Denies shortness of breath. Gastrointestinal: No abdominal pain.  No nausea, no vomiting.  No diarrhea.  No constipation. Genitourinary: Negative for dysuria. Musculoskeletal: Negative for back pain. Skin: Negative for rash. Neurological: Positive for syncope. Negative for headaches, focal weakness or numbness. Psychiatric:Denies active depression, SI/HI/AH/VH.  10-point ROS otherwise negative.  ____________________________________________   PHYSICAL EXAM:  VITAL SIGNS: ED Triage Vitals  Enc Vitals Group     BP --      Pulse --      Resp --      Temp --      Temp src --      SpO2 --      Weight --      Height --      Head Cir --      Peak Flow --      Pain Score --      Pain Loc --      Pain Edu? --      Excl. in [place]? --     Constitutional: Alert and oriented. Well appearing and in  no acute distress. Eyes: Conjunctivae are normal. PERRL. EOMI. Head: Atraumatic. Nose: No congestion/rhinnorhea. Mouth/Throat: Mucous membranes are moist.  Oropharynx non-erythematous. Neck: No stridor.  No cervical spine tenderness to palpation. Cardiovascular: Normal rate, regular rhythm. Grossly normal heart sounds.  Good peripheral circulation. Respiratory: Normal respiratory effort.  No retractions. Lungs CTAB. Gastrointestinal: Soft and nontender. No distention. No abdominal bruits. No CVA tenderness. Musculoskeletal: No lower extremity tenderness nor edema.  No joint effusions. Neurologic:  Normal speech and language. No gross focal neurologic deficits are appreciated.  Skin:  Skin is warm, dry and intact. No rash noted. Psychiatric: Mood and  affect are normal. Speech and behavior are normal.  ____________________________________________   LABS (all labs ordered are listed, but only abnormal results are displayed)  Labs Reviewed  CBC WITH DIFFERENTIAL/PLATELET - Abnormal; Notable for the following:    Neutro Abs 8.5 (*)    All other components within normal limits  COMPREHENSIVE METABOLIC PANEL - Abnormal; Notable for the following:    Sodium 132 (*)    Creatinine, Ser 1.08 (*)    Total Protein 8.2 (*)    AST 201 (*)    ALT 224 (*)    GFR calc non Af Amer 60 (*)    All other components within normal limits  ACETAMINOPHEN LEVEL - Abnormal; Notable for the following:    Acetaminophen (Tylenol), Serum <10 (*)    All other components within normal limits  URINALYSIS COMPLETEWITH MICROSCOPIC ([facility] ONLY) - Abnormal; Notable for the following:    Color, Urine YELLOW (*)    APPearance CLOUDY (*)    Protein, ur 30 (*)    Nitrite POSITIVE (*)    Bacteria, UA MANY (*)    Squamous Epithelial / LPF 6-30 (*)    All other components within normal limits  URINE DRUG SCREEN, QUALITATIVE ([facility] ONLY) - Abnormal; Notable for the following:    Cocaine Metabolite,Ur [place] POSITIVE (*)    Opiate, Ur Screen POSITIVE (*)    Cannabinoid 50 Ng, Ur [place] POSITIVE (*)    All other components within normal limits  SALICYLATE LEVEL  TROPONIN I   ____________________________________________  EKG  ED ECG REPORT I, SUNG,JADE J, the attending physician, personally viewed and interpreted this ECG.   Date: 05/20/2015  EKG Time: 0209  Rate: 87  Rhythm: normal EKG, normal sinus rhythm  Axis: Normal  Intervals:none  ST&T Change: Nonspecific  ____________________________________________  RADIOLOGY  None ____________________________________________   PROCEDURES  Procedure(s) performed: None  Critical Care performed: No  ____________________________________________   INITIAL IMPRESSION / ASSESSMENT AND PLAN / ED COURSE  Pertinent  labs & imaging results that were available during my care of the patient were reviewed by me and considered in my medical decision making (see chart for details).  49 year old female who presents with accidental overdose secondary to chewing 2 fentanyl patches of unknown strength. IM Narcan was given with good response. Will check screening labs, urinalysis and observe.  ----------------------------------------- 4:38 AM on 05/20/2015 -----------------------------------------  Patient resting in no acute distress. Room air saturations 98%. Respirations 16. Urine drug screen noted to be positive for cocaine and marijuana as well as opiates. Will continue to observe in the ED.  ----------------------------------------- 5:39 AM on 05/20/2015 -----------------------------------------  Patient ate a meal tray, sitting up and resting in no acute distress. Awake and alert.  ----------------------------------------- 6:42 AM on 05/20/2015 -----------------------------------------  Respirations 18, room air saturations 98%. Discussed with patient and encouraged her to avoid illicit drugs. Strict return precautions given. Patient  verbalizes understanding and agrees with plan of care.  ----------------------------------------- 7:35 AM on 05/20/2015 -----------------------------------------  Addendum - patient was discharged without prescription for Cipro for her UTI. Will have nurse navigator call in an antibiotic prescription for this patient. ____________________________________________   FINAL CLINICAL IMPRESSION(S) / ED DIAGNOSES  Final diagnoses:  Accidental overdose, initial encounter  Cocaine use  Marijuana use      Paulette Blanch, MD 05/20/15 [phone]

## 2015-05-20 NOTE — ED Notes (Signed)
Patient calling for ride. Patient up walking around room with steady gait noted. Patient states, "Valerie Wallace have been so nice. I am sorry. I am not going to stop doing marijuana, but I am not going to do any more cocaine". Patient's IVs have been removed as charted. VSS. Patient to be discharged home with roommate per MD order.

## 2015-05-20 NOTE — ED Notes (Signed)
RN to bedside to find that patient had removed monitoring equipment and oxygen to use the bedside toilet. Patient assisted back into bed and equipment replaced. Patient somnolent and falls back to sleep quickly; RR 4-6, however patient intermittently wakes and takes several breaths. Patient becomes tearful and asks if she is going to die. RN reassured patient. Patient aware of alarms that are set - reports that is why she wakes up and takes several breaths  - "I am trying to keep that number at least 12 like you told me. I am trying to keep the blue number (SPO2) at 100. Am I doing a good job? Please don't let me die." RN assured patient that she was constantly being monitoring and that alarms were in place to prevent complications. Patient assured and appreciative.

## 2015-05-20 NOTE — Discharge Instructions (Signed)
1. Stop using illegal drugs. 2. Take only medicines that are prescribed to you. 3. Return to the ER for worsening symptoms, persistent vomiting, lethargy, difficulty breathing or other concerns.  Accidental Overdose A drug overdose occurs when a chemical substance (drug or medication) is used in amounts large enough to overcome a person. This may result in severe illness or death. This is a type of poisoning. Accidental overdoses of medications or other substances come from a variety of reasons. When this happens accidentally, it is often because the person taking the substance does not know enough about what they have taken. Drugs which commonly cause overdose deaths are alcohol, psychotropic medications (medications which affect the mind), pain medications, illegal drugs (street drugs) such as cocaine and heroin, and multiple drugs taken at the same time. It may result from careless behavior (such as over-indulging at a party). Other causes of overdose may include multiple drug use, a lapse in memory, or drug use after a period of no drug use.  Sometimes overdosing occurs because a person cannot remember if they have taken their medication.  A common unintentional overdose in young children involves multi-vitamins containing iron. Iron is a part of the hemoglobin molecule in blood. It is used to transport oxygen to living cells. When taken in small amounts, iron allows the body to restock hemoglobin. In large amounts, it causes problems in the body. If this overdose is not treated, it can lead to death. Never take medicines that show signs of tampering or do not seem quite right. Never take medicines in the dark or in poor lighting. Read the label and check each dose of medicine before you take it. When adults are poisoned, it happens most often through carelessness or lack of information. Taking medicines in the dark or taking medicine prescribed for someone else to treat the same type of problem is a  dangerous practice. SYMPTOMS  Symptoms of overdose depend on the medication and amount taken. They can vary from over-activity with stimulant over-dosage, to sleepiness from depressants such as alcohol, narcotics and tranquilizers. Confusion, dizziness, nausea and vomiting may be present. If problems are severe enough coma and death may result. DIAGNOSIS  Diagnosis and management are generally straightforward if the drug is known. Otherwise it is more difficult. At times, certain symptoms and signs exhibited by the patient, or blood tests, can reveal the drug in question.  TREATMENT  In an emergency department, most patients can be treated with supportive measures. Antidotes may be available if there has been an overdose of opioids or benzodiazepines. A rapid improvement will often occur if this is the cause of overdose. At home or away from medical care:  There may be no immediate problems or warning signs in children.  Not everything works well in all cases of poisoning.  Take immediate action. Poisons may act quickly.  If you think someone has swallowed medicine or a household product, and the person is unconscious, having seizures (convulsions), or is not breathing, immediately call for an ambulance. IF a person is conscious and appears to be doing OK but has swallowed a poison:  Do not wait to see what effect the poison will have. Immediately call a poison control center (listed in the white pages of your telephone book under "Poison Control" or inside the front cover with other emergency numbers). Some poison control centers have TTY capability for the deaf. Check with your local center if you or someone in your family requires this service.  Keep  the container so you can read the label on the product for ingredients.  Describe what, when, and how much was taken and the age and condition of the person poisoned. Inform them if the person is vomiting, choking, drowsy, shows a change in  color or temperature of skin, is conscious or unconscious, or is convulsing.  Do not cause vomiting unless instructed by medical personnel. Do not induce vomiting or force liquids into a person who is convulsing, unconscious, or very drowsy. Stay calm and in control.   Activated charcoal also is sometimes used in certain types of poisoning and you may wish to add a supply to your emergency medicines. It is available without a prescription. Call a poison control center before using this medication. PREVENTION  Thousands of children die every year from unintentional poisoning. This may be from household chemicals, poisoning from carbon monoxide in a car, taking their parent's medications, or simply taking a few iron pills or vitamins with iron. Poisoning comes from unexpected sources.  Store medicines out of the sight and reach of children, preferably in a locked cabinet. Do not keep medications in a food cabinet. Always store your medicines in a secure place. Get rid of expired medications.  If you have children living with you or have them as occasional guests, you should have child-resistant caps on your medicine containers. Keep everything out of reach. Child proof your home.  If you are called to the telephone or to answer the door while you are taking a medicine, take the container with you or put the medicine out of the reach of small children.  Do not take your medication in front of children. Do not tell your child how good a medication is and how good it is for them. They may get the idea it is more of a treat.  If you are an adult and have accidentally taken an overdose, you need to consider how this happened and what can be done to prevent it from happening again. If this was from a street drug or alcohol, determine if there is a problem that needs addressing. If you are not sure a problems exists, it is easy to talk to a professional and ask them if they think you have a problem. It is  better to handle this problem in this way before it happens again and has a much worse consequence.   This information is not intended to replace advice given to you by your health care provider. Make sure you discuss any questions you have with your health care provider.   Document Released: 09/15/2004 Document Revised: 07/23/2014 Document Reviewed: 12/20/2014 Elsevier Interactive Patient Education 2016 [place] dependency is an addiction to drugs or alcohol. It is characterized by the repeated behavior of seeking out and using drugs and alcohol despite harmful consequences to the health and safety of ones self and others.  RISK FACTORS There are certain situations or behaviors that increase a person's risk for chemical dependency. These include:  A family history of chemical dependency.  A history of mental health issues, including depression and anxiety.  A home environment where drugs and alcohol are easily available to you.  Drug or alcohol use at a young age. SYMPTOMS  The following symptoms can indicate chemical dependency:  Inability to limit the use of drugs or alcohol.  Nausea, sweating, shakiness, and anxiety that occurs when alcohol or drugs are not being used.  An increase in amount of drugs  or alcohol that is necessary to get drunk or high. People who experience these symptoms can assess their use of drugs and alcohol by asking themselves the following questions:  Have you been told by friends or family that they are worried about your use of alcohol or drugs?  Do friends and family ever tell you about things you did while drinking alcohol or using drugs that you do not remember?  Do you lie about using alcohol or drugs or about the amounts you use?  Do you have difficulty completing daily tasks unless you use alcohol or drugs?  Is the level of your work or school performance lower because of your drug or alcohol use?  Do you  get sick from using drugs or alcohol but keep using anyway?  Do you feel uncomfortable in social situations unless you use alcohol or drugs?  Do you use drugs or alcohol to help forget problems? An answer of yes to any of these questions may indicate chemical dependency. Professional evaluation is suggested.   This information is not intended to replace advice given to you by your health care provider. Make sure you discuss any questions you have with your health care provider.   Document Released: 06/26/2001 Document Revised: 09/24/2011 Document Reviewed: 09/07/2010 Elsevier Interactive Patient Education 2016 [employer].  Cannabis Use Disorder Cannabis use disorder is a mental disorder. It is not one-time or occasional use of cannabis, more commonly known as marijuana. Cannabis use disorder is the continued, nonmedical use of cannabis that interferes with normal life activities or causes health problems. People with cannabis use disorder get a feeling of extreme pleasure and relaxation from cannabis use. This "high" is very rewarding and causes people to use over and over.  The mind-altering ingredient in cannabis is know as THC. THC can also interfere with motor coordination, memory, judgment, and accurate sense of space and time. These effects can last for a few days after using cannabis. Regular heavy cannabis use can cause long-lasting problems with thinking and learning. In young people, these problems may be permanent. Cannabis sometimes causes severe anxiety, paranoia, or visual hallucinations. Man-made (synthetic) cannabis-like drugs, such as "spice" and "K2," cause the same effects as THC but are much stronger. Cannabis-like drugs can cause dangerously high blood pressure and heart rate.  Cannabis use disorder usually starts in the teenage years. It can trigger the development of schizophrenia. It is somewhat more common in men than women. People who have family members with the disorder  or existing mental health issues such as depression and posttraumatic stress disorderare more likely to develop cannabis use disorder. People with cannabis use disorder are at higher risk for use of other drugs of abuse.  SIGNS AND SYMPTOMS Signs and symptoms of cannabis use disorder include:   Use of cannabis in larger amounts or over a longer period than intended.   Unsuccessful attempts to cut down or control cannabis use.   A lot of time spent obtaining, using, or recovering from the effects of cannabis.   A strong desire or urge to use cannabis (cravings).   Continued use of cannabis in spite of problems at work, school, or home because of use.   Continued use of cannabis in spite of relationship problems because of use.  Giving up or cutting down on important life activities because of cannabis use.  Use of cannabis over and over even in situations when it is physically hazardous, such as when driving a car.   Continued  use of cannabis in spite of a physical problem that is likely related to use. Physical problems can include:  Chronic cough.  Bronchitis.  Emphysema.  Throat and lung cancer.  Continued use of cannabis in spite of a mental problem that is likely related to use. Mental problems can include:  Psychosis.  Anxiety.  Difficulty sleeping.  Need to use more and more cannabis to get the same effect, or lessened effect over time with use of the same amount (tolerance).  Having withdrawal symptoms when cannabis use is stopped, or using cannabis to reduce or avoid withdrawal symptoms. Withdrawal symptoms include:  Irritability or anger.  Anxiety or restlessness.  Difficulty sleeping.  Loss of appetite or weight.  Aches and pains.  Shakiness.  Sweating.  Chills. DIAGNOSIS Cannabis use disorder is diagnosed by your health care provider. You may be asked questions about your cannabis use and how it affects your life. A physical exam may be  done. A drug screen may be done. You may be referred to a mental health professional. The diagnosis of cannabis use disorder requires at least two symptoms within 12 months. The type of cannabis use disorder you have depends on the number of symptoms you have. The type may be:  Mild. Two or three signs and symptoms.   Moderate. Four or five signs and symptoms.   Severe. Six or more signs and symptoms.  TREATMENT Treatment is usually provided by mental health professionals with training in substance use disorders. The following options are available:  Counseling or talk therapy. Talk therapy addresses the reasons you use cannabis. It also addresses ways to keep you from using again. The goals of talk therapy include:  Identifying and avoiding triggers for use.  Learning how to handle cravings.  Replacing use with healthy activities.  Support groups. Support groups provide emotional support, advice, and guidance.  Medicine. Medicine is used to treat mental health issues that trigger cannabis use or that result from it. HOME CARE INSTRUCTIONS  Take medicines only as directed by your health care provider.  Check with your health care provider before starting any new medicines.  Keep all follow-up visits as directed by your health care provider. SEEK MEDICAL CARE IF:  You are not able to take your medicines as directed.  Your symptoms get worse. SEEK IMMEDIATE MEDICAL CARE IF: You have serious thoughts about hurting yourself or others. [place] on Drug Abuse: motorcyclefax.com  Substance Abuse and Mental Health Services Administration: ktimeonline.com   This information is not intended to replace advice given to you by your health care provider. Make sure you discuss any questions you have with your health care provider.   Document Released: 06/29/2000 Document Revised: 07/23/2014 Document Reviewed: 07/15/2013 Elsevier Interactive Patient  Education 2016 [place].  Stimulant Use Disorder-Cocaine Cocaine is one of a group of powerful drugs called stimulants. Cocaine has medical uses for stopping nosebleeds and for pain control before minor nose or dental surgery. However, cocaine is misused because of the effects that it produces. These effects include:   A feeling of extreme pleasure.  Alertness.  High energy. Common street names for cocaine include coke, crack, blow, snow, and nose candy. Cocaine is snorted, dissolved in water and injected, or smoked.  Stimulants are addictive because they activate regions of the brain that produce both the pleasurable sensation of "reward" and psychological dependence. Together, these actions account for loss of control and the rapid development of drug dependence. This means  you become ill without the drug (withdrawal) and need to keep using it to function.  Stimulant use disorder is use of stimulants that disrupts your daily life. It disrupts relationships with family and friends and how you do your job. Cocaine increases your blood pressure and heart rate. It can cause a heart attack or stroke. Cocaine can also cause death from irregular heart rate or seizures. SYMPTOMS Symptoms of stimulant use disorder with cocaine include:  Use of cocaine in larger amounts or over a longer period of time than intended.  Unsuccessful attempts to cut down or control cocaine use.  A lot of time spent obtaining, using, or recovering from the effects of cocaine.  A strong desire or urge to use cocaine (craving).  Continued use of cocaine in spite of major problems at work, school, or home because of use.  Continued use of cocaine in spite of relationship problems because of use.  Giving up or cutting down on important life activities because of cocaine use.  Use of cocaine over and over in situations when it is physically hazardous, such as driving a car.  Continued use of cocaine in spite of a  physical problem that is likely related to use. Physical problems can include:  Malnutrition.  Nosebleeds.  Chest pain.  High blood pressure.  A hole that develops between the part of your nose that separates your nostrils (perforated nasal septum).  Lung and kidney damage.  Continued use of cocaine in spite of a mental problem that is likely related to use. Mental problems can include:  Schizophrenia-like symptoms.  Depression.  Bipolar mood swings.  Anxiety.  Sleep problems.  Need to use more and more cocaine to get the same effect, or lessened effect over time with use of the same amount of cocaine (tolerance).  Having withdrawal symptoms when cocaine use is stopped, or using cocaine to reduce or avoid withdrawal symptoms. Withdrawal symptoms include:  Depressed or irritable mood.  Low energy or restlessness.  Bad dreams.  Poor or excessive sleep.  Increased appetite. DIAGNOSIS Stimulant use disorder is diagnosed by your health care provider. You may be asked questions about your cocaine use and how it affects your life. A physical exam may be done. A drug screen may be ordered. You may be referred to a mental health professional. The diagnosis of stimulant use disorder requires at least two symptoms within 12 months. The type of stimulant use disorder depends on the number of signs and symptoms you have. The type may be:  Mild. Two or three signs and symptoms.  Moderate. Four or five signs and symptoms.  Severe. Six or more signs and symptoms. TREATMENT Treatment for stimulant use disorder is usually provided by mental health professionals with training in substance use disorders. The following options are available:  Counseling or talk therapy. Talk therapy addresses the reasons you use cocaine and ways to keep you from using again. Goals of talk therapy include:  Identifying and avoiding triggers for use.  Handling cravings.  Replacing use with healthy  activities.  Support groups. Support groups provide emotional support, advice, and guidance.  Medicine. Certain medicines may decrease cocaine cravings or withdrawal symptoms. HOME CARE INSTRUCTIONS  Take medicines only as directed by your health care provider.  Identify the people and activities that trigger your cocaine use and avoid them.  Keep all follow-up visits as directed by your health care provider. SEEK MEDICAL CARE IF:  Your symptoms get worse or you relapse.  You  are not able to take medicines as directed. SEEK IMMEDIATE MEDICAL CARE IF:  You have serious thoughts about hurting yourself or others.  You have a seizure, chest pain, sudden weakness, or loss of speech or vision. [place] on Drug Abuse: motorcyclefax.com  Substance Abuse and Mental Health Services Administration: ktimeonline.com   This information is not intended to replace advice given to you by your health care provider. Make sure you discuss any questions you have with your health care provider.   Document Released: 06/29/2000 Document Revised: 07/23/2014 Document Reviewed: 07/15/2013 Elsevier Interactive Patient Education [employer].

## 2015-05-20 NOTE — ED Notes (Signed)
Patient presents to ED 2 from [facility] where she works. Patient suffered the effects of an unintentional overdose tonight after "chewing" two Fentanyl patches (unknown strength). 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 Narcan 2mg  IM was given and patient immediately became more conscious and alert.

## 2015-05-20 NOTE — ED Notes (Signed)
Patient observed sleeping with NAD noted. Respiratory rate has increased while sleeping; RR 10 while sleeping. No verbalized needs at this time. Will continue to monitor.

## 2015-05-20 NOTE — ED Notes (Signed)
Meal tray provided.

## 2015-05-20 NOTE — ED Notes (Signed)
Patient HYPOventilating when not stimulated. RR decreases to 4 and SPO2 quickly decreases. Patient responds appropriately when stimulated; RR increases to 16 and sats immediately improve. Patient placed on supplemental oxygen at 2L/[place]. MD made aware of probable need for additional Narcan dose.

## 2015-05-30 ENCOUNTER — Telehealth: Payer: Self-pay | Admitting: Emergency Medicine

## 2015-05-30 NOTE — ED Notes (Signed)
Called pt to ask if she has followed up.  Found paper from 11/4 that says to call in cipro for uti, but would like pt to follow up with pcp if possible.  Left message asking her to call me.

## 2015-09-14 ENCOUNTER — Encounter: Payer: Self-pay | Admitting: Emergency Medicine

## 2015-09-14 ENCOUNTER — Emergency Department: Payer: Self-pay

## 2015-09-14 ENCOUNTER — Emergency Department
Admission: EM | Admit: 2015-09-14 | Discharge: 2015-09-14 | Disposition: A | Discharge status: Home / Self Care | Payer: Self-pay | Attending: Emergency Medicine | Admitting: Emergency Medicine

## 2015-09-14 DIAGNOSIS — F17200 Nicotine dependence, unspecified, uncomplicated: Secondary | ICD-10-CM | POA: Insufficient documentation

## 2015-09-14 DIAGNOSIS — L03113 Cellulitis of right upper limb: Secondary | ICD-10-CM | POA: Insufficient documentation

## 2015-09-14 DIAGNOSIS — Y998 Other external cause status: Secondary | ICD-10-CM | POA: Insufficient documentation

## 2015-09-14 DIAGNOSIS — Y9389 Activity, other specified: Secondary | ICD-10-CM | POA: Insufficient documentation

## 2015-09-14 DIAGNOSIS — L03114 Cellulitis of left upper limb: Secondary | ICD-10-CM

## 2015-09-14 DIAGNOSIS — Z880 Allergy status to penicillin: Secondary | ICD-10-CM | POA: Insufficient documentation

## 2015-09-14 DIAGNOSIS — Y92410 Unspecified street and highway as the place of occurrence of the external cause: Secondary | ICD-10-CM | POA: Insufficient documentation

## 2015-09-14 DIAGNOSIS — S40012A Contusion of left shoulder, initial encounter: Secondary | ICD-10-CM | POA: Insufficient documentation

## 2015-09-14 MED ORDER — OXYCODONE HCL 5 MG PO TABS
5.0000 mg | ORAL_TABLET | Freq: Once | ORAL | Status: AC
  Administered 2015-09-14: 5 mg via ORAL
  Filled 2015-09-14: qty 1

## 2015-09-14 MED ORDER — CEPHALEXIN 500 MG PO CAPS: 500.0000 mg | ORAL_CAPSULE | Freq: Four times a day (QID) | ORAL | Status: AC

## 2015-09-14 MED ORDER — OXYCODONE HCL 5 MG PO TABS: 5.0000 mg | ORAL_TABLET | Freq: Three times a day (TID) | ORAL | Status: AC | PRN
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
## 2015-10-15 DEATH — deceased
